# Patient Record
Sex: Female | Born: 1980 | Race: White | Hispanic: No | Marital: Married | Smoking: Never smoker
Health system: Southern US, Community
[De-identification: ages and names within clinical notes are randomized; demographics above are authoritative.]

## PROBLEM LIST (undated history)

## (undated) DIAGNOSIS — I10 Essential (primary) hypertension: Secondary | ICD-10-CM

## (undated) DIAGNOSIS — F32A Depression, unspecified: Secondary | ICD-10-CM

## (undated) DIAGNOSIS — F329 Major depressive disorder, single episode, unspecified: Secondary | ICD-10-CM

## (undated) HISTORY — PX: HERNIA REPAIR: SHX51

## (undated) HISTORY — PX: TUBAL LIGATION: SHX77

---

## 2016-08-13 ENCOUNTER — Emergency Department
Admission: EM | Admit: 2016-08-13 | Discharge: 2016-08-13 | Disposition: A | Discharge status: Home / Self Care | Payer: BLUE CROSS/BLUE SHIELD | Attending: Emergency Medicine | Admitting: Emergency Medicine

## 2016-08-13 ENCOUNTER — Emergency Department: Payer: BLUE CROSS/BLUE SHIELD

## 2016-08-13 DIAGNOSIS — J189 Pneumonia, unspecified organism: Secondary | ICD-10-CM

## 2016-08-13 DIAGNOSIS — J181 Lobar pneumonia, unspecified organism: Secondary | ICD-10-CM | POA: Insufficient documentation

## 2016-08-13 DIAGNOSIS — R0789 Other chest pain: Secondary | ICD-10-CM | POA: Diagnosis present

## 2016-08-13 DIAGNOSIS — I10 Essential (primary) hypertension: Secondary | ICD-10-CM | POA: Insufficient documentation

## 2016-08-13 DIAGNOSIS — Z79899 Other long term (current) drug therapy: Secondary | ICD-10-CM | POA: Diagnosis not present

## 2016-08-13 HISTORY — DX: Major depressive disorder, single episode, unspecified: F32.9

## 2016-08-13 HISTORY — DX: Depression, unspecified: F32.A

## 2016-08-13 HISTORY — DX: Essential (primary) hypertension: I10

## 2016-08-13 LAB — CBC
HCT: 37.5 % (ref 35.0–47.0)
Hemoglobin: 13.3 g/dL (ref 12.0–16.0)
MCH: 30.7 pg (ref 26.0–34.0)
MCHC: 35.4 g/dL (ref 32.0–36.0)
MCV: 86.6 fL (ref 80.0–100.0)
PLATELETS: 266 10*3/uL (ref 150–440)
RBC: 4.33 MIL/uL (ref 3.80–5.20)
RDW: 13.9 % (ref 11.5–14.5)
WBC: 10.6 10*3/uL (ref 3.6–11.0)

## 2016-08-13 LAB — BASIC METABOLIC PANEL
Anion gap: 7 (ref 5–15)
BUN: 13 mg/dL (ref 6–20)
CALCIUM: 8.9 mg/dL (ref 8.9–10.3)
CO2: 25 mmol/L (ref 22–32)
CREATININE: 0.65 mg/dL (ref 0.44–1.00)
Chloride: 103 mmol/L (ref 101–111)
GFR calc Af Amer: >60 mL/min (ref >60)
GFR calc non Af Amer: >60 mL/min (ref >60)
Glucose, Bld: 136 mg/dL — ABNORMAL HIGH (ref 65–99)
Potassium: 3.8 mmol/L (ref 3.5–5.1)
SODIUM: 135 mmol/L (ref 135–145)

## 2016-08-13 LAB — INFLUENZA PANEL BY PCR (TYPE A & B)
INFLAPCR: NEGATIVE
INFLBPCR: NEGATIVE

## 2016-08-13 LAB — TROPONIN I: Troponin I: <0.03 ng/mL (ref <0.03)

## 2016-08-13 MED ORDER — IBUPROFEN 400 MG PO TABS: 400.0000 mg | ORAL_TABLET | Freq: Four times a day (QID) | ORAL | 0 refills | Status: AC | PRN

## 2016-08-13 MED ORDER — KETOROLAC TROMETHAMINE 30 MG/ML IJ SOLN
15.0000 mg | Freq: Once | INTRAMUSCULAR | Status: AC
  Administered 2016-08-13: 15 mg via INTRAVENOUS
  Filled 2016-08-13: qty 1

## 2016-08-13 MED ORDER — AZITHROMYCIN 250 MG PO TABS: ORAL_TABLET | ORAL | 0 refills | Status: AC

## 2016-08-13 MED ORDER — CEFTRIAXONE SODIUM-DEXTROSE 1-3.74 GM-% IV SOLR
1.0000 g | Freq: Once | INTRAVENOUS | Status: AC
  Administered 2016-08-13: 1 g via INTRAVENOUS
  Filled 2016-08-13 (×2): qty 50

## 2016-08-13 MED ORDER — DEXTROSE 5 % IV SOLN: 1.0000 g | Freq: Once | INTRAVENOUS | Status: DC

## 2016-08-13 MED ORDER — SODIUM CHLORIDE 0.9 % IV BOLUS (SEPSIS)
1000.0000 mL | Freq: Once | INTRAVENOUS | Status: AC
  Administered 2016-08-13: 1000 mL via INTRAVENOUS

## 2016-08-13 NOTE — ED Provider Notes (Signed)
Time Seen: Approximately 0728  I have reviewed the triage notes  Chief Complaint: Chest Pain   History of Present Illness: Judith Downs is a 35 y.o. female he states that she's had some upper respiratory symptoms now for the last several weeks. She states last night about 3 AM she started developing some left-sided chest discomfort.She denies any shortness of breath or current productive cough. She denies any pulmonary emboli risk factors and was unaware that she had a fever.   Past Medical History:  Diagnosis Date  . Depression   . Hypertension     There are no active problems to display for this patient.   Past Surgical History:  Procedure Laterality Date  . HERNIA REPAIR    . REPEAT CESAREAN SECTION    . TUBAL LIGATION      Past Surgical History:  Procedure Laterality Date  . HERNIA REPAIR    . REPEAT CESAREAN SECTION    . TUBAL LIGATION      Current Outpatient Rx  . Order #: [id number] Class: Historical Med  . Order #: [id number] Class: Historical Med  . Order #: [id number] Class: Historical Med  . Order #: [id number] Class: Print  . Order #: [id number] Class: Print    Allergies:  Patient has no known allergies.  Family History: No family history on file.  Social History: Social History  Substance Use Topics  . Smoking status: Never Smoker  . Smokeless tobacco: Never Used  . Alcohol use Yes     Review of Systems:   10 point review of systems was performed and was otherwise negative:  Constitutional: No fever Eyes: No visual disturbances ENT: No sore throat, ear pain Cardiac: eft-sided chest discomfort toward the left flank region Respiratory: No shortness of breath, wheezing, or stridor Abdomen: No abdominal pain, no vomiting, No diarrhea Endocrine: No weight loss, No night sweats Extremities: No peripheral edema, cyanosis Skin: No rashes, easy bruising Neurologic: No focal weakness, trouble with speech or swollowing Urologic: No dysuria,  Hematuria, or urinary frequency   Physical Exam:  ED Triage Vitals  Enc Vitals Group     BP 08/13/16 0422 132/64     Pulse Rate 08/13/16 0422 (!) 102     Resp 08/13/16 0422 20     Temp 08/13/16 0422 99.7 F (37.6 C)     Temp Source 08/13/16 0422 Oral     SpO2 08/13/16 0422 96 %     Weight 08/13/16 0424 275 lb (124.7 kg)     Height 08/13/16 0424 5\' 7"  (1.702 m)     Head Circumference --      Peak Flow --      Pain Score 08/13/16 0424 8     Pain Loc --      Pain Edu? --      Excl. in GC? --     General: Awake , Alert , and Oriented times 3; GCS 15 Head: Normal cephalic , atraumatic Eyes: Pupils equal , round, reactive to light Nose/Throat: No nasal drainage, patent upper airway without erythema or exudate.  Neck: Supple, Full range of motion, No anterior adenopathy or palpable thyroid masses Lungs: Clear to ascultation without wheezes , rhonchi, or rales Heart: Regular rate, regular rhythm without murmurs , gallops , or rubs Abdomen: Soft, non tender without rebound, guarding , or rigidity; bowel sounds positive and symmetric in all 4 quadrants. No organomegaly .        Extremities: 2 plus symmetric pulses. No edema, clubbing or cyanosis Neurologic: normal  ambulation, Motor symmetric without deficits, sensory intact Skin: warm, dry, no rashes   Labs:   All laboratory work was reviewed including any pertinent negatives or positives listed below:  Labs Reviewed  BASIC METABOLIC PANEL - Abnormal; Notable for the following:       Result Value   Glucose, Bld 136 (*)    All other components within normal limits  CBC  TROPONIN I  INFLUENZA PANEL BY PCR (TYPE A & B, H1N1)  influenza test was negative  EKG:  ED ECG REPORT I, Jennye MoccasinBrian S Quigley, the attending physician, personally viewed and interpreted this ECG.  Date: 08/13/2016 EKG Time: 0407 Rate:102 Rhythm: normal sinus rhythm QRS Axis: normal Intervals: normal ST/T Wave abnormalities: Nonspecific ST-T wave  abnormality Conduction Disturbances: none Narrative Interpretation: unremarkable no acute ischemic changes   Radiology: * "Dg Chest 2 View  Result Date: 08/13/2016 CLINICAL DATA:  35 y/o F; substernal chest pain. Six weeks of upper respiratory symptoms. EXAM: CHEST  2 VIEW COMPARISON:  None. FINDINGS: Normal cardiac silhouette. Consolidation in the left lower lobe superior segment. Bones are unremarkable. No pleural effusion. IMPRESSION: Consolidation in left lower lobe superior segment, probably pneumonia. Electronically Signed   By: Mitzi HansenLance  Furusawa-Stratton M.D.   On: 08/13/2016 04:46  "  I personally reviewed the radiologic studies     ED Course:  Patient had an IV established and was given a gram of Rocephin and will be discharged on a Zithromax for what appears to be community-acquired pneumonia. I felt this was unlikely to be a pulmonary embolism, acute coronary syndrome, etc. Clinical Course      Assessment: * Community acquired pneumonia  Final Clinical Impression:   Final diagnoses:  Community acquired pneumonia of left lower lobe of lung (HCC)     Plan:  outpatient " Discharge Medication List as of 08/13/2016  8:47 AM    START taking these medications   Details  azithromycin (ZITHROMAX Z-PAK) 250 MG tablet Take 2 tablets (500 mg) on  Day 1,  followed by 1 tablet (250 mg) once daily on Days 2 through 5., Print    ibuprofen (ADVIL,MOTRIN) 400 MG tablet Take 1 tablet (400 mg total) by mouth every 6 (six) hours as needed., Starting Tue 08/13/2016, Print      " Patient was advised to return immediately if condition worsens. Patient was advised to follow up with their primary care physician or other specialized physicians involved in their outpatient care. The patient and/or family member/power of attorney had laboratory results reviewed at the bedside. All questions and concerns were addressed and appropriate discharge instructions were distributed by the nursing  staff.            Jennye MoccasinBrian S Quigley, MD 08/13/16 [id number]

## 2016-08-13 NOTE — ED Triage Notes (Signed)
Pt presents to ED with c/o substernal chest pain since 3am that woke her up from sleep. Pt reports having an URI for the last 6 weeks (treated with ABX). Pt reports some SHOB, but denies N/V/D or fever; denies radiation of pain into her neck, jaw, or arms. Pt reports pain increases with inspiration; pt is mildly anxious, otherwise in NAD, with respirations even, regular, and unlabored.

## 2016-08-13 NOTE — Discharge Instructions (Signed)
Please drink plenty of fluids and take an over-the-counter multivitamin. Return to the emergency department especially if he have high fever, increased shortness of breath, or any other new concerns.  Please return immediately if condition worsens. Please contact her primary physician or the physician you were given for referral. If you have any specialist physicians involved in her treatment and plan please also contact them. Thank you for using [facility].

## 2017-04-29 ENCOUNTER — Ambulatory Visit: Payer: Self-pay

## 2017-04-29 VITALS — Temp 97.6°F

## 2017-04-29 DIAGNOSIS — Z111 Encounter for screening for respiratory tuberculosis: Secondary | ICD-10-CM

## 2017-04-29 LAB — TB SKIN TEST
INDURATION: 0 mm
TB Skin Test: NEGATIVE

## 2017-04-29 NOTE — Progress Notes (Signed)
04/29/2017 9:05AM- Placed 0.22mL Tubersol intradermally into patient's left forearm. Lot number [id number]. Expiration date: 08/01/2019. Pt has never had an abnormal reading for PPD and is aware of the reasoning for the test. Pt was instructed not to scratch, poke, or or aggravate the injection site. Pt verbalized understanding. Pt will return on 05/01/17 at 9:30AM to have the test read.  PPD test resulted by Aurelio Jew, RN on 05/01/2017.

## 2017-05-01 ENCOUNTER — Ambulatory Visit: Payer: Self-pay

## 2018-01-07 IMAGING — CR DG CHEST 2V
1 series · 2 of 2 positions shown · non-contrast
Comparison: None.

CLINICAL DATA: 35 y/o F; substernal chest pain. Six weeks of upper
respiratory symptoms.

EXAM:
CHEST  2 VIEW

[Series 1: dg chest 2 view · 0.14mm/px · 2 of 2 slices shown]
[im 1/2]
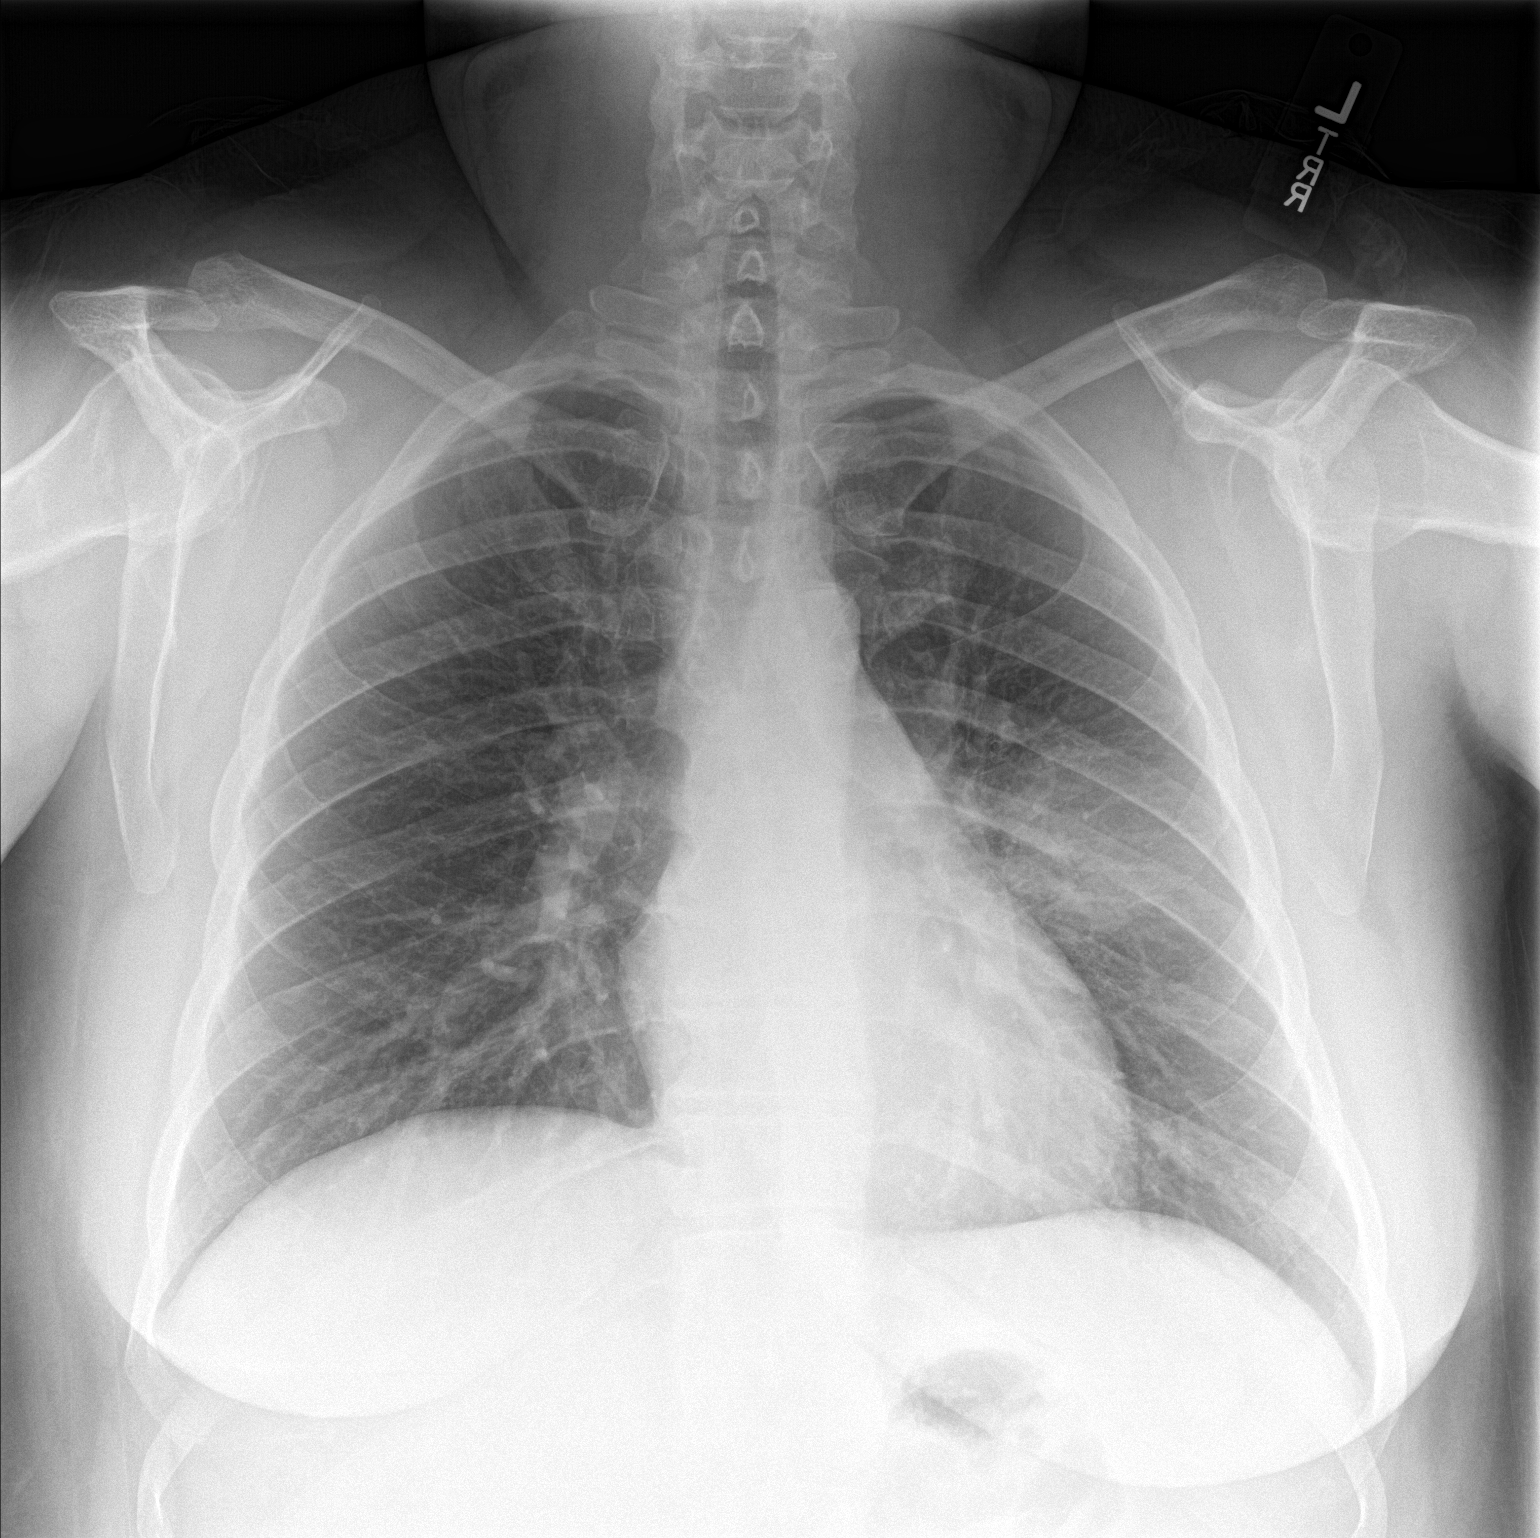
[im 2/2]
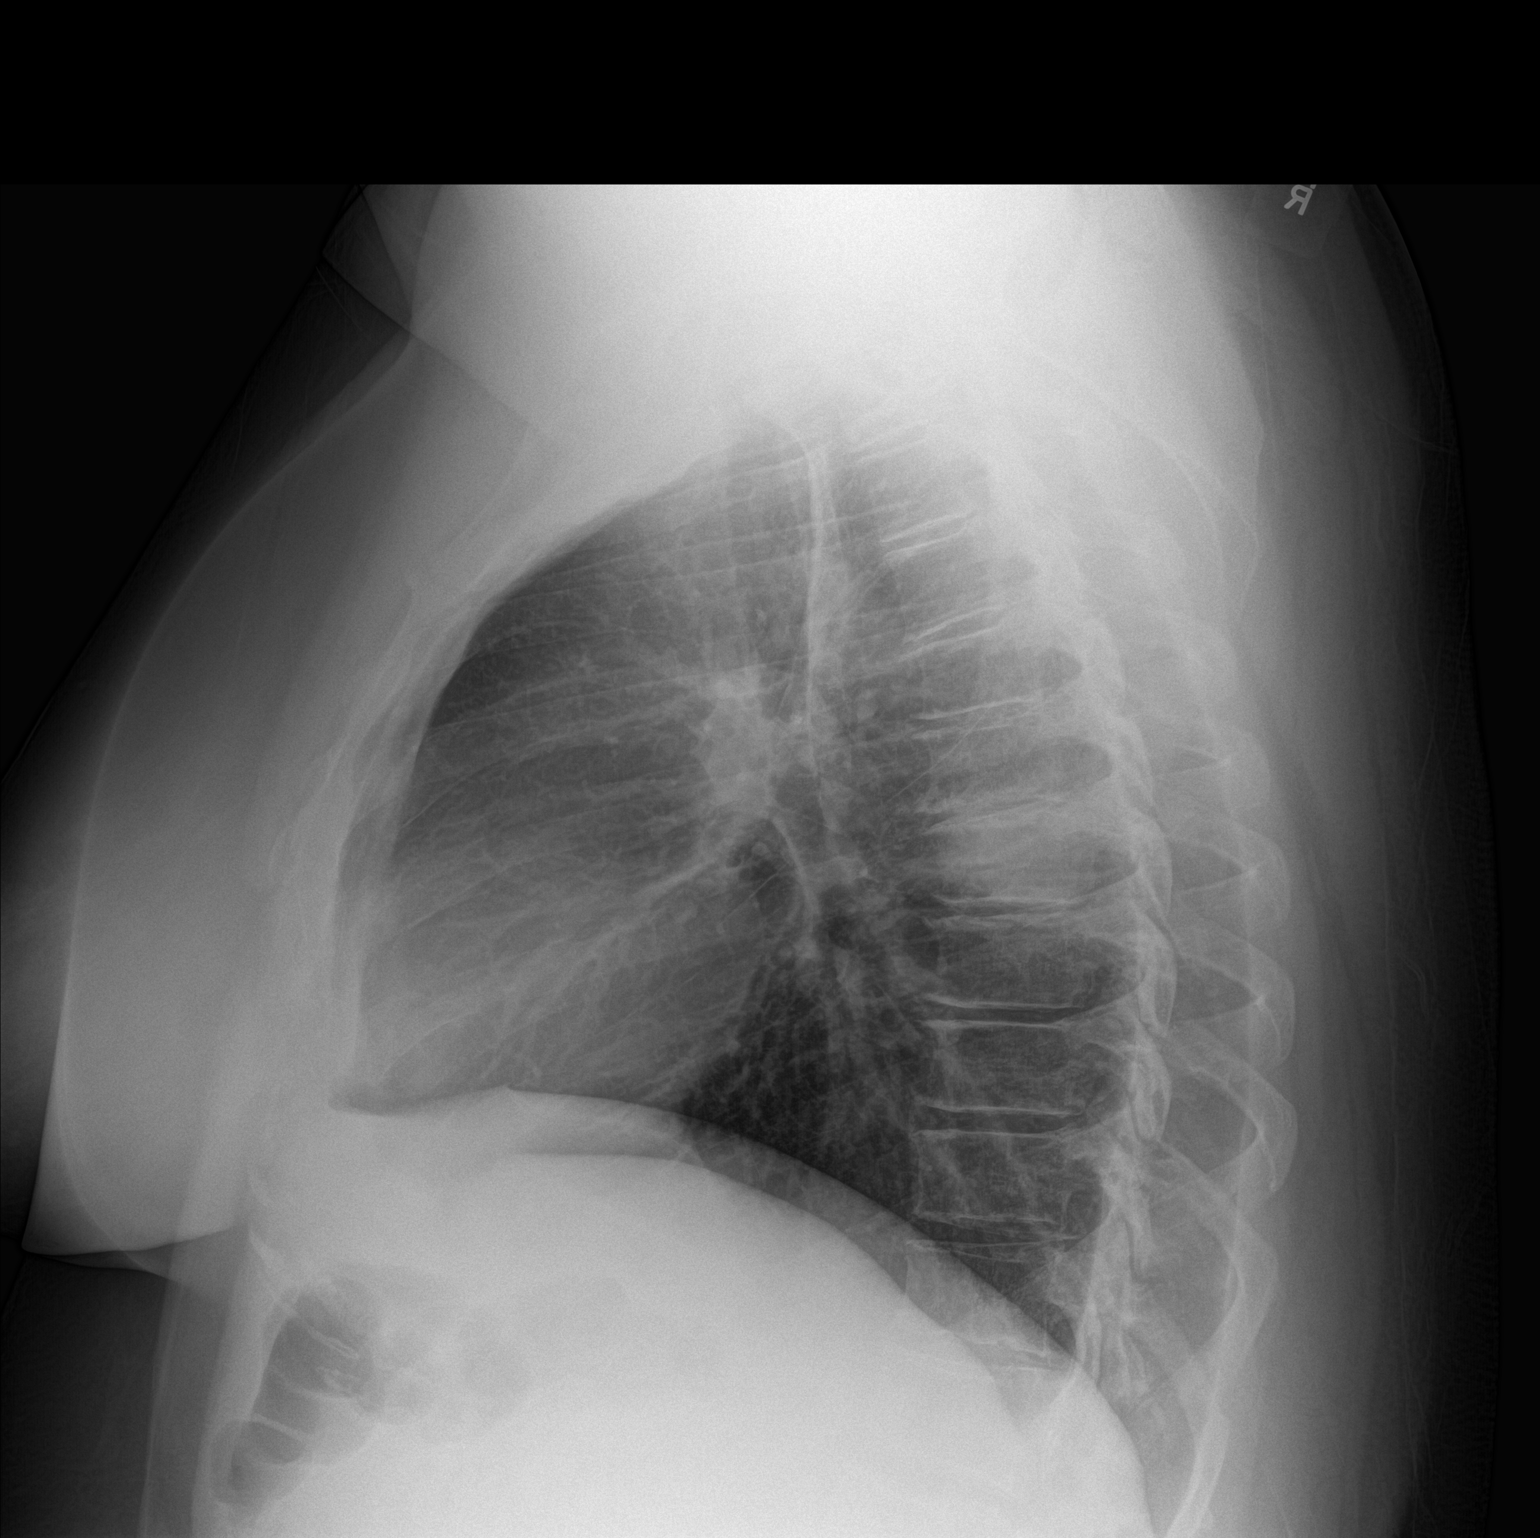

[2 of 2 positions shown; findings below may reference images not displayed]

FINDINGS: Normal cardiac silhouette. Consolidation in the left lower lobe
superior segment. Bones are unremarkable. No pleural effusion.
IMPRESSION: Consolidation in left lower lobe superior segment, probably
pneumonia.

By: Klever Jumper M.D.
